# Patient Record
Sex: Female | Born: 1985
Health system: Southern US, Community
[De-identification: ages and names within clinical notes are randomized; demographics above are authoritative.]

## PROBLEM LIST (undated history)

## (undated) DIAGNOSIS — Z1371 Encounter for nonprocreative screening for genetic disease carrier status: Secondary | ICD-10-CM

## (undated) DIAGNOSIS — Z803 Family history of malignant neoplasm of breast: Secondary | ICD-10-CM

## (undated) DIAGNOSIS — Z9289 Personal history of other medical treatment: Secondary | ICD-10-CM

## (undated) DIAGNOSIS — R61 Generalized hyperhidrosis: Secondary | ICD-10-CM

## (undated) DIAGNOSIS — N83209 Unspecified ovarian cyst, unspecified side: Secondary | ICD-10-CM

## (undated) HISTORY — DX: Encounter for nonprocreative screening for genetic disease carrier status: Z13.71

## (undated) HISTORY — DX: Generalized hyperhidrosis: R61

## (undated) HISTORY — DX: Personal history of other medical treatment: Z92.89

## (undated) HISTORY — DX: Family history of malignant neoplasm of breast: Z80.3

## (undated) HISTORY — DX: Unspecified ovarian cyst, unspecified side: N83.209

---
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
## 2005-10-06 ENCOUNTER — Ambulatory Visit: Payer: Self-pay | Admitting: Family Medicine

## 2005-11-05 ENCOUNTER — Ambulatory Visit: Payer: Self-pay | Admitting: Family Medicine

## 2006-05-07 ENCOUNTER — Emergency Department: Payer: Self-pay | Admitting: General Practice

## 2006-12-15 HISTORY — PX: APPENDECTOMY: SHX54

## 2007-04-18 ENCOUNTER — Inpatient Hospital Stay: Payer: Self-pay | Admitting: General Surgery

## 2013-03-22 DIAGNOSIS — R61 Generalized hyperhidrosis: Secondary | ICD-10-CM

## 2013-03-22 DIAGNOSIS — Z803 Family history of malignant neoplasm of breast: Secondary | ICD-10-CM

## 2013-03-22 HISTORY — DX: Family history of malignant neoplasm of breast: Z80.3

## 2013-03-22 HISTORY — DX: Generalized hyperhidrosis: R61

## 2013-07-15 DIAGNOSIS — Z1371 Encounter for nonprocreative screening for genetic disease carrier status: Secondary | ICD-10-CM

## 2013-07-15 HISTORY — DX: Encounter for nonprocreative screening for genetic disease carrier status: Z13.71

## 2016-09-22 LAB — HM PAP SMEAR

## 2016-09-23 ENCOUNTER — Other Ambulatory Visit: Payer: Self-pay | Admitting: *Deleted

## 2016-09-23 ENCOUNTER — Other Ambulatory Visit: Payer: Self-pay | Admitting: Advanced Practice Midwife

## 2016-09-23 ENCOUNTER — Inpatient Hospital Stay
Admission: RE | Admit: 2016-09-23 | Discharge: 2016-09-23 | Disposition: A | Discharge status: Home / Self Care | Payer: Self-pay | Source: Ambulatory Visit | Attending: *Deleted | Admitting: *Deleted

## 2016-09-23 DIAGNOSIS — N6320 Unspecified lump in the left breast, unspecified quadrant: Secondary | ICD-10-CM

## 2016-09-23 DIAGNOSIS — Z9289 Personal history of other medical treatment: Secondary | ICD-10-CM

## 2016-10-07 ENCOUNTER — Ambulatory Visit: Payer: Self-pay

## 2016-10-07 ENCOUNTER — Other Ambulatory Visit: Payer: Self-pay

## 2016-10-09 ENCOUNTER — Ambulatory Visit
Admission: RE | Admit: 2016-10-09 | Discharge: 2016-10-09 | Disposition: A | Discharge status: Home / Self Care | Payer: BLUE CROSS/BLUE SHIELD | Source: Ambulatory Visit | Attending: Advanced Practice Midwife | Admitting: Advanced Practice Midwife

## 2016-10-09 DIAGNOSIS — N6320 Unspecified lump in the left breast, unspecified quadrant: Secondary | ICD-10-CM | POA: Insufficient documentation

## 2016-10-09 LAB — HM MAMMOGRAPHY

## 2018-01-20 ENCOUNTER — Ambulatory Visit: Payer: Self-pay | Admitting: Maternal Newborn

## 2018-02-03 ENCOUNTER — Ambulatory Visit (INDEPENDENT_AMBULATORY_CARE_PROVIDER_SITE_OTHER): Payer: BLUE CROSS/BLUE SHIELD | Admitting: Maternal Newborn

## 2018-02-03 ENCOUNTER — Encounter: Payer: Self-pay | Admitting: Maternal Newborn

## 2018-02-03 VITALS — BP 94/60 | HR 72 | Ht 64.0 in | Wt 126.0 lb

## 2018-02-03 DIAGNOSIS — B373 Candidiasis of vulva and vagina: Secondary | ICD-10-CM

## 2018-02-03 DIAGNOSIS — Z01419 Encounter for gynecological examination (general) (routine) without abnormal findings: Secondary | ICD-10-CM | POA: Diagnosis not present

## 2018-02-03 DIAGNOSIS — Z124 Encounter for screening for malignant neoplasm of cervix: Secondary | ICD-10-CM

## 2018-02-03 DIAGNOSIS — Z30011 Encounter for initial prescription of contraceptive pills: Secondary | ICD-10-CM | POA: Diagnosis not present

## 2018-02-03 DIAGNOSIS — B3731 Acute candidiasis of vulva and vagina: Secondary | ICD-10-CM

## 2018-02-03 DIAGNOSIS — Z1231 Encounter for screening mammogram for malignant neoplasm of breast: Secondary | ICD-10-CM | POA: Diagnosis not present

## 2018-02-03 MED ORDER — NORETHIN-ETH ESTRAD-FE BIPHAS 1 MG-10 MCG / 10 MCG PO TABS: 1.0000 | ORAL_TABLET | Freq: Every day | ORAL | 3 refills | Status: DC

## 2018-02-03 MED ORDER — FLUCONAZOLE 150 MG PO TABS: 150.0000 mg | ORAL_TABLET | Freq: Once | ORAL | 3 refills | Status: AC

## 2018-02-03 NOTE — Progress Notes (Signed)
  Gynecology Annual Exam  PCP: Christine Luna, No Pcp Per  Chief Complaint:  Chief Complaint  Christine Luna presents with  . Gynecologic Exam    History of Present Illness: Christine Luna is a 32 y.o. G1P1001 presents for annual exam. The Christine Luna has no complaints today.   LMP: Christine Luna's last menstrual period was 01/23/2018. Average Interval: regular, 28 days Duration of flow: several days Heavy Menses: no Clots: no Intermenstrual Bleeding: no Postcoital Bleeding: no Dysmenorrhea: no  The Christine Luna is sexually active. She currently uses condoms for contraception. She denies dyspareunia.  The Christine Luna does perform self breast exams.  There is notable family history of breast or ovarian cancer in her family.  The Christine Luna wears seatbelts: yes.   The Christine Luna has regular exercise: yes.    The Christine Luna denies current symptoms of depression.    Review of Systems  Constitutional: Negative for malaise/fatigue and weight loss.  HENT: Negative.   Eyes: Negative.   Respiratory: Negative for cough, shortness of breath and wheezing.   Cardiovascular: Negative for chest pain and palpitations.  Gastrointestinal: Negative for constipation, diarrhea, heartburn and nausea.  Genitourinary:       Vulvar and vaginal irritation  Musculoskeletal: Negative.   Skin:       Acne, taking minocycline  Neurological: Negative for dizziness and headaches.  Endo/Heme/Allergies: Negative.   Psychiatric/Behavioral: Negative for depression. The Christine Luna is not nervous/anxious.   All other systems reviewed and are negative.   Past Medical History:  Past Medical History:  Diagnosis Date  . BRCA negative 07/2013  . Family history of breast cancer 03/22/2013   mom age 42, triple neg, stage 3 BIBC; PT IS BRCA NEG; IBIS=18.1%; UPDATE MYRISK TESTING LETTER SENT 11/17  . History of Papanicolaou smear of cervix 03/22/13;09/22/16   NEG; -/-  . Hyperhidrosis 03/22/2013  . Ovarian cyst     Past Surgical History:  Past Surgical  History:  Procedure Laterality Date  . APPENDECTOMY  2008  . HERNIA REPAIR  1990    Gynecologic History:  Christine Luna's last menstrual period was 01/23/2018. Contraception: condoms Last Pap: 09/22/2016 Results were: NIL and HR HPV negative   Obstetric History: G1P1001  Family History:  Family History  Adopted: Yes  Problem Relation Age of Onset  . Breast cancer Mother 39    Social History:  Social History   Socioeconomic History  . Marital status: Married    Spouse name: Not on file  . Number of children: 1  . Years of education: 12  . Highest education level: Not on file  Social Needs  . Financial resource strain: Not on file  . Food insecurity - worry: Not on file  . Food insecurity - inability: Not on file  . Transportation needs - medical: Not on file  . Transportation needs - non-medical: Not on file  Occupational History  . Not on file  Tobacco Use  . Smoking status: Never Smoker  . Smokeless tobacco: Never Used  Substance and Sexual Activity  . Alcohol use: Yes    Comment: OCC  . Drug use: No  . Sexual activity: Yes    Birth control/protection: Condom  Other Topics Concern  . Not on file  Social History Narrative  . Not on file    Allergies:  No Active Allergies  Medications: Prior to Admission medications   Medication Sig Start Date End Date Taking? Authorizing Provider  METROGEL 1 % gel Apply 1 application topically daily. 12/09/17  Yes [provider]  minocycline (MINOCIN,DYNACIN)   100 MG capsule Take 1 capsule by mouth daily. 02/02/18  Yes [provider]    Physical Exam Vitals: Blood pressure 94/60, pulse 72, height 5' 4" (1.626 m), weight 126 lb (57.2 kg), last menstrual period 01/23/2018.  General: NAD HEENT: normocephalic, anicteric Thyroid: no enlargement, no palpable nodules Pulmonary: No increased work of breathing, CTAB Cardiovascular: RRR, S1 and S2 auscultated, no murmurs, rubs or gallops Breast: Breasts  symmetrical, no tenderness, no palpable nodules or masses, no skin or nipple retraction present, no nipple discharge.  No axillary or supraclavicular lymphadenopathy. Abdomen: soft, non-tender, non-distended.  Umbilicus without lesions.  No hepatomegaly, splenomegaly or masses palpable. No evidence of hernia  Genitourinary:  External: Vulvar irritation apparent. Otherwise, normal external  female genitalia.  Normal urethral meatus, normal Bartholin's and  Skene's glands.    Vagina: Normal vaginal mucosa, no evidence of prolapse; thick,  white discharge present.    Cervix: Grossly normal in appearance, no bleeding  Uterus: Non-enlarged, mobile, normal contour.  No CMT  Adnexa: ovaries non-enlarged, no adnexal masses  Rectal: deferred  Lymphatic: no evidence of inguinal lymphadenopathy Extremities: no edema, erythema, or tenderness Neurologic: Grossly intact Psychiatric: mood appropriate, affect full  Assessment: 32 y.o. G1P1001 routine annual exam, yeast infection.  Plan: Problem List Items Addressed This Visit    None    Visit Diagnoses    Encounter for annual routine gynecological examination    -  Primary   Encounter for oral contraception initial prescription       Relevant Medications   Norethindrone-Ethinyl Estradiol-Fe Biphas (LO LOESTRIN FE) 1 MG-10 MCG / 10 MCG tablet   Vulvovaginal candidiasis       Relevant Medications   fluconazole (DIFLUCAN) 150 MG tablet   Pap smear for cervical cancer screening       Relevant Orders   Pap IG and HPV (high risk) DNA detection   Encounter for screening mammogram for breast cancer       Relevant Orders   MM DIGITAL SCREENING BILATERAL      1) STI screening was offered and declined.  2) ASCCP guidelines and rationale discussed.  Christine Luna opts for every 3 year screening interval.  3) Contraception - Education given regarding options for contraception, including LARCs and oral contraceptives. She expresses some desire to start oral  contraceptives, but when she has tried to use them in the past, she always quit after the first month or two because of spotting. Explained that spotting is a normal side effect with OCP initiation and that it may take several months for it to resolve. Rx given for LoLoEstrin with sample pack of pills.  4) Routine healthcare maintenance including cholesterol, diabetes screening discussed; declines.  5) Screening mammogram ordered due to family history of breast cancer and diagnostic mammogram in 2017 (BI-RADS I) with recommended annual screening.  6) Has recurrent yeast infections when she takes minocycline for acne. Symptomatic today. Rx given for Diflucan and advised probiotics.  7) Follow up 1 year for routine annual exam.  Avel Sensor, CNM 02/03/2018  1:48 PM

## 2018-02-06 LAB — PAP IG AND HPV HIGH-RISK
HPV, HIGH-RISK: NEGATIVE
PAP Smear Comment: 0

## 2018-02-23 ENCOUNTER — Encounter: Payer: Self-pay | Admitting: Obstetrics and Gynecology

## 2018-03-26 DIAGNOSIS — D225 Melanocytic nevi of trunk: Secondary | ICD-10-CM | POA: Diagnosis not present

## 2018-03-26 DIAGNOSIS — D2261 Melanocytic nevi of right upper limb, including shoulder: Secondary | ICD-10-CM | POA: Diagnosis not present

## 2018-03-26 DIAGNOSIS — D2262 Melanocytic nevi of left upper limb, including shoulder: Secondary | ICD-10-CM | POA: Diagnosis not present

## 2018-03-26 DIAGNOSIS — L309 Dermatitis, unspecified: Secondary | ICD-10-CM | POA: Diagnosis not present

## 2018-07-07 DIAGNOSIS — H6693 Otitis media, unspecified, bilateral: Secondary | ICD-10-CM | POA: Diagnosis not present

## 2020-01-02 DIAGNOSIS — H5213 Myopia, bilateral: Secondary | ICD-10-CM | POA: Diagnosis not present

## 2020-01-26 ENCOUNTER — Other Ambulatory Visit: Payer: Self-pay | Admitting: Otolaryngology

## 2020-01-26 DIAGNOSIS — R519 Headache, unspecified: Secondary | ICD-10-CM | POA: Diagnosis not present

## 2020-01-26 DIAGNOSIS — J301 Allergic rhinitis due to pollen: Secondary | ICD-10-CM | POA: Diagnosis not present

## 2020-01-26 DIAGNOSIS — R221 Localized swelling, mass and lump, neck: Secondary | ICD-10-CM

## 2020-01-30 ENCOUNTER — Ambulatory Visit (INDEPENDENT_AMBULATORY_CARE_PROVIDER_SITE_OTHER): Payer: BC Managed Care – PPO | Admitting: Obstetrics and Gynecology

## 2020-01-30 ENCOUNTER — Other Ambulatory Visit: Payer: Self-pay

## 2020-01-30 ENCOUNTER — Encounter: Payer: Self-pay | Admitting: Obstetrics and Gynecology

## 2020-01-30 VITALS — BP 110/70 | Ht 64.0 in | Wt 131.0 lb

## 2020-01-30 DIAGNOSIS — Z803 Family history of malignant neoplasm of breast: Secondary | ICD-10-CM

## 2020-01-30 DIAGNOSIS — N6310 Unspecified lump in the right breast, unspecified quadrant: Secondary | ICD-10-CM

## 2020-01-30 DIAGNOSIS — Z1231 Encounter for screening mammogram for malignant neoplasm of breast: Secondary | ICD-10-CM

## 2020-01-30 NOTE — Patient Instructions (Signed)
I value your feedback and entrusting us with your care. If you get a [facility] patient survey, I would appreciate you taking the time to let us know about your experience today. Thank you!  As of November 24, 2019, your lab results will be released to your MyChart immediately, before I even have a chance to see them. Please give me time to review them and contact you if there are any abnormalities. Thank you for your patience.  

## 2020-01-30 NOTE — Progress Notes (Signed)
Patient, No Pcp Per   Chief Complaint  Patient presents with  . Breast exam    lump on right breast, little tender since Friday    HPI:      Ms. Christine Luna is a 34 y.o. G1P1001 who LMP was Patient's last menstrual period was 01/16/2020 (approximate)., presents today for slightly tender RT breast mass, noted 4 days ago. No change in size since first noticed. Does SBE and hasn't felt this before. No erythema, nipple d/c, skin changes, erythema.   Last mammo 10/09/16 without abnormlities; had breast u/s due to LT breast mass on palpation. Strong FH breast cancer in her mom age 33. Pt is BRCA/Bart neg 2014, never had update testing. IBIS=18%  Recently saw ENT for LT cervican LAN and sore throat. CT scheduled 02/07/20.  Last annual 2/19.  Past Medical History:  Diagnosis Date  . BRCA negative 07/2013   BRCA/BART neg  . Family history of breast cancer 03/22/2013   mom age 51, triple neg, stage 3 BIBC;  IBIS=18.1%; UPDATE MYRISK TESTING LETTER SENT 11/17, 3/19  . History of Papanicolaou smear of cervix 03/22/13;09/22/16   NEG; -/-  . Hyperhidrosis 03/22/2013  . Ovarian cyst     Past Surgical History:  Procedure Laterality Date  . APPENDECTOMY  2008  . HERNIA REPAIR  1990    Family History  Adopted: Yes  Problem Relation Age of Onset  . Breast cancer Mother 59       triple neg    Social History   Socioeconomic History  . Marital status: Married    Spouse name: Not on file  . Number of children: 1  . Years of education: 21  . Highest education Luna: Not on file  Occupational History  . Not on file  Tobacco Use  . Smoking status: Never Smoker  . Smokeless tobacco: Never Used  Substance and Sexual Activity  . Alcohol use: Yes    Comment: OCC  . Drug use: No  . Sexual activity: Yes    Birth control/protection: Condom  Other Topics Concern  . Not on file  Social History Narrative  . Not on file   Social Determinants of Health   Financial Resource Strain:    . Difficulty of Paying Living Expenses: Not on file  Food Insecurity:   . Worried About Charity fundraiser in the Last Year: Not on file  . Ran Out of Food in the Last Year: Not on file  Transportation Needs:   . Lack of Transportation (Medical): Not on file  . Lack of Transportation (Non-Medical): Not on file  Physical Activity:   . Days of Exercise per Week: Not on file  . Minutes of Exercise per Session: Not on file  Stress:   . Feeling of Stress : Not on file  Social Connections:   . Frequency of Communication with Friends and Family: Not on file  . Frequency of Social Gatherings with Friends and Family: Not on file  . Attends Religious Services: Not on file  . Active Member of Clubs or Organizations: Not on file  . Attends Archivist Meetings: Not on file  . Marital Status: Not on file  Intimate Partner Violence:   . Fear of Current or Ex-Partner: Not on file  . Emotionally Abused: Not on file  . Physically Abused: Not on file  . Sexually Abused: Not on file    Outpatient Medications Prior to Visit  Medication Sig Dispense Refill  .  METROGEL 1 % gel Apply 1 application topically daily.  6  . minocycline (MINOCIN,DYNACIN) 100 MG capsule Take 1 capsule by mouth daily.  0  . Norethindrone-Ethinyl Estradiol-Fe Biphas (LO LOESTRIN FE) 1 MG-10 MCG / 10 MCG tablet Take 1 tablet by mouth daily. 84 tablet 3   No facility-administered medications prior to visit.      ROS:  Review of Systems  Constitutional: Negative for fever.  Gastrointestinal: Negative for blood in stool, constipation, diarrhea, nausea and vomiting.  Genitourinary: Negative for dyspareunia, dysuria, flank pain, frequency, hematuria, urgency, vaginal bleeding, vaginal discharge and vaginal pain.  Musculoskeletal: Negative for back pain.  Skin: Negative for rash.   BREAST: mass, tenderness   OBJECTIVE:   Vitals:  BP 110/70   Ht '5\' 4"'  (1.626 m)   Wt 131 lb (59.4 kg)   LMP 01/16/2020  (Approximate)   BMI 22.49 kg/m   Physical Exam Vitals reviewed.  Pulmonary:     Effort: Pulmonary effort is normal.  Chest:     Breasts: Breasts are symmetrical.        Right: No inverted nipple, mass, nipple discharge, skin change or tenderness.        Left: No inverted nipple, mass, nipple discharge, skin change or tenderness.    Musculoskeletal:        General: Normal range of motion.     Cervical back: Normal range of motion.  Skin:    General: Skin is warm and dry.  Neurological:     General: No focal deficit present.     Mental Status: She is alert and oriented to person, place, and time.     Cranial Nerves: No cranial nerve deficit.  Psychiatric:        Mood and Affect: Mood normal.        Behavior: Behavior normal.        Thought Content: Thought content normal.        Judgment: Judgment normal.     Assessment/Plan: Breast mass, right - Plan: US BREAST LTD UNI RIGHT INC AXILLA, US BREAST LTD UNI LEFT INC AXILLA, MM DIAG BREAST TOMO BILATERAL; RT breast 11-2:00 position. Check dx mammo and u/s. Will f/u with results.   Encounter for screening mammogram for malignant neoplasm of breast - Plan: US BREAST LTD UNI RIGHT INC AXILLA, US BREAST LTD UNI LEFT INC AXILLA, MM DIAG BREAST TOMO BILATERAL  Family history of breast cancer - Plan: US BREAST LTD UNI RIGHT INC AXILLA, US BREAST LTD UNI LEFT INC AXILLA, MM DIAG BREAST TOMO BILATERAL; Pt is BRCA neg. Discussed updated cancer genetic testing. Pt will wait till annual.     Return if symptoms worsen or fail to improve.  Alvin Rubano B. Aditri Louischarles, PA-C 01/30/2020 4:53 PM

## 2020-01-31 ENCOUNTER — Telehealth: Payer: Self-pay | Admitting: Obstetrics and Gynecology

## 2020-01-31 NOTE — Telephone Encounter (Signed)
Patient aware of her appt time and date at [facility] on 02/01/20 at 2:00 pm.

## 2020-02-01 ENCOUNTER — Ambulatory Visit
Admission: RE | Admit: 2020-02-01 | Discharge: 2020-02-01 | Disposition: A | Discharge status: Home / Self Care | Payer: BC Managed Care – PPO | Source: Ambulatory Visit | Attending: Obstetrics and Gynecology | Admitting: Obstetrics and Gynecology

## 2020-02-01 DIAGNOSIS — Z803 Family history of malignant neoplasm of breast: Secondary | ICD-10-CM

## 2020-02-01 DIAGNOSIS — Z1231 Encounter for screening mammogram for malignant neoplasm of breast: Secondary | ICD-10-CM

## 2020-02-01 DIAGNOSIS — R922 Inconclusive mammogram: Secondary | ICD-10-CM | POA: Diagnosis not present

## 2020-02-01 DIAGNOSIS — N6310 Unspecified lump in the right breast, unspecified quadrant: Secondary | ICD-10-CM

## 2020-02-01 DIAGNOSIS — N6311 Unspecified lump in the right breast, upper outer quadrant: Secondary | ICD-10-CM | POA: Diagnosis not present

## 2020-02-01 DIAGNOSIS — N6312 Unspecified lump in the right breast, upper inner quadrant: Secondary | ICD-10-CM | POA: Diagnosis not present

## 2020-02-02 ENCOUNTER — Other Ambulatory Visit: Payer: Self-pay | Admitting: Obstetrics and Gynecology

## 2020-02-02 ENCOUNTER — Encounter: Payer: Self-pay | Admitting: Obstetrics and Gynecology

## 2020-02-02 DIAGNOSIS — R928 Other abnormal and inconclusive findings on diagnostic imaging of breast: Secondary | ICD-10-CM

## 2020-02-02 NOTE — Telephone Encounter (Signed)
Biopsy info.

## 2020-02-03 DIAGNOSIS — Z171 Estrogen receptor negative status [ER-]: Secondary | ICD-10-CM | POA: Diagnosis not present

## 2020-02-03 DIAGNOSIS — N6315 Unspecified lump in the right breast, overlapping quadrants: Secondary | ICD-10-CM | POA: Diagnosis not present

## 2020-02-03 DIAGNOSIS — C50811 Malignant neoplasm of overlapping sites of right female breast: Secondary | ICD-10-CM | POA: Diagnosis not present

## 2020-02-03 DIAGNOSIS — C50911 Malignant neoplasm of unspecified site of right female breast: Secondary | ICD-10-CM | POA: Diagnosis not present

## 2020-02-03 DIAGNOSIS — N6311 Unspecified lump in the right breast, upper outer quadrant: Secondary | ICD-10-CM | POA: Diagnosis not present

## 2020-02-06 MED ORDER — NITROFURANTOIN MONOHYD MACRO 100 MG PO CAPS: 100.0000 mg | ORAL_CAPSULE | Freq: Two times a day (BID) | ORAL | 0 refills | Status: AC

## 2020-02-07 ENCOUNTER — Ambulatory Visit: Admission: RE | Admit: 2020-02-07 | Payer: BC Managed Care – PPO | Source: Ambulatory Visit

## 2020-02-13 DIAGNOSIS — C50919 Malignant neoplasm of unspecified site of unspecified female breast: Secondary | ICD-10-CM

## 2020-02-13 HISTORY — DX: Malignant neoplasm of unspecified site of unspecified female breast: C50.919

## 2020-02-15 ENCOUNTER — Ambulatory Visit: Payer: BC Managed Care – PPO

## 2020-02-16 ENCOUNTER — Ambulatory Visit: Payer: BC Managed Care – PPO | Attending: Internal Medicine

## 2020-02-16 DIAGNOSIS — Z23 Encounter for immunization: Secondary | ICD-10-CM

## 2020-02-16 NOTE — Progress Notes (Signed)
   Covid-19 Vaccination Clinic  Name:  Christine Luna    MRN: [id number] DOB: 1986/03/01  02/16/2020  Christine Luna was observed post Covid-19 immunization for 15 minutes without incident. She was provided with Vaccine Information Sheet and instruction to access the V-Safe system.   Christine Luna was instructed to call 911 with any severe reactions post vaccine: Marland Kitchen Difficulty breathing  . Swelling of face and throat  . A fast heartbeat  . A bad rash all over body  . Dizziness and weakness

## 2020-02-20 DIAGNOSIS — N6325 Unspecified lump in the left breast, overlapping quadrants: Secondary | ICD-10-CM | POA: Diagnosis not present

## 2020-02-20 DIAGNOSIS — C50919 Malignant neoplasm of unspecified site of unspecified female breast: Secondary | ICD-10-CM | POA: Diagnosis not present

## 2020-02-20 DIAGNOSIS — F418 Other specified anxiety disorders: Secondary | ICD-10-CM | POA: Diagnosis not present

## 2020-02-20 DIAGNOSIS — Z171 Estrogen receptor negative status [ER-]: Secondary | ICD-10-CM | POA: Diagnosis not present

## 2020-02-20 DIAGNOSIS — C50411 Malignant neoplasm of upper-outer quadrant of right female breast: Secondary | ICD-10-CM | POA: Diagnosis not present

## 2020-02-20 DIAGNOSIS — N959 Unspecified menopausal and perimenopausal disorder: Secondary | ICD-10-CM | POA: Diagnosis not present

## 2020-02-20 DIAGNOSIS — F419 Anxiety disorder, unspecified: Secondary | ICD-10-CM | POA: Diagnosis not present

## 2020-02-20 DIAGNOSIS — Z803 Family history of malignant neoplasm of breast: Secondary | ICD-10-CM | POA: Diagnosis not present

## 2020-02-20 DIAGNOSIS — C50911 Malignant neoplasm of unspecified site of right female breast: Secondary | ICD-10-CM | POA: Diagnosis not present

## 2020-02-21 ENCOUNTER — Encounter: Payer: Self-pay | Admitting: Obstetrics and Gynecology

## 2020-02-27 DIAGNOSIS — N6310 Unspecified lump in the right breast, unspecified quadrant: Secondary | ICD-10-CM | POA: Diagnosis not present

## 2020-02-27 DIAGNOSIS — N83201 Unspecified ovarian cyst, right side: Secondary | ICD-10-CM | POA: Diagnosis not present

## 2020-02-27 DIAGNOSIS — C50411 Malignant neoplasm of upper-outer quadrant of right female breast: Secondary | ICD-10-CM | POA: Diagnosis not present

## 2020-02-27 DIAGNOSIS — R911 Solitary pulmonary nodule: Secondary | ICD-10-CM | POA: Diagnosis not present

## 2020-02-27 DIAGNOSIS — Z171 Estrogen receptor negative status [ER-]: Secondary | ICD-10-CM | POA: Diagnosis not present

## 2020-02-28 DIAGNOSIS — N6325 Unspecified lump in the left breast, overlapping quadrants: Secondary | ICD-10-CM | POA: Diagnosis not present

## 2020-02-28 DIAGNOSIS — N959 Unspecified menopausal and perimenopausal disorder: Secondary | ICD-10-CM | POA: Diagnosis not present

## 2020-02-28 DIAGNOSIS — C50411 Malignant neoplasm of upper-outer quadrant of right female breast: Secondary | ICD-10-CM | POA: Diagnosis not present

## 2020-02-28 DIAGNOSIS — Z79899 Other long term (current) drug therapy: Secondary | ICD-10-CM | POA: Diagnosis not present

## 2020-02-28 DIAGNOSIS — N6032 Fibrosclerosis of left breast: Secondary | ICD-10-CM | POA: Diagnosis not present

## 2020-02-28 DIAGNOSIS — Z171 Estrogen receptor negative status [ER-]: Secondary | ICD-10-CM | POA: Diagnosis not present

## 2020-02-28 DIAGNOSIS — N6012 Diffuse cystic mastopathy of left breast: Secondary | ICD-10-CM | POA: Diagnosis not present

## 2020-02-28 DIAGNOSIS — N62 Hypertrophy of breast: Secondary | ICD-10-CM | POA: Diagnosis not present

## 2020-02-28 DIAGNOSIS — N83201 Unspecified ovarian cyst, right side: Secondary | ICD-10-CM | POA: Diagnosis not present

## 2020-02-28 DIAGNOSIS — F418 Other specified anxiety disorders: Secondary | ICD-10-CM | POA: Diagnosis not present

## 2020-02-29 DIAGNOSIS — D252 Subserosal leiomyoma of uterus: Secondary | ICD-10-CM | POA: Diagnosis not present

## 2020-02-29 DIAGNOSIS — N83201 Unspecified ovarian cyst, right side: Secondary | ICD-10-CM | POA: Diagnosis not present

## 2020-02-29 DIAGNOSIS — C50919 Malignant neoplasm of unspecified site of unspecified female breast: Secondary | ICD-10-CM | POA: Diagnosis not present

## 2020-03-01 DIAGNOSIS — Z171 Estrogen receptor negative status [ER-]: Secondary | ICD-10-CM | POA: Diagnosis not present

## 2020-03-01 DIAGNOSIS — C50411 Malignant neoplasm of upper-outer quadrant of right female breast: Secondary | ICD-10-CM | POA: Diagnosis not present

## 2020-03-01 DIAGNOSIS — F418 Other specified anxiety disorders: Secondary | ICD-10-CM | POA: Diagnosis not present

## 2020-03-01 DIAGNOSIS — N959 Unspecified menopausal and perimenopausal disorder: Secondary | ICD-10-CM | POA: Diagnosis not present

## 2020-03-01 DIAGNOSIS — N83201 Unspecified ovarian cyst, right side: Secondary | ICD-10-CM | POA: Diagnosis not present

## 2020-03-02 ENCOUNTER — Encounter: Payer: Self-pay | Admitting: Obstetrics and Gynecology

## 2020-03-03 ENCOUNTER — Encounter: Payer: Self-pay | Admitting: Obstetrics and Gynecology

## 2020-03-06 DIAGNOSIS — C50811 Malignant neoplasm of overlapping sites of right female breast: Secondary | ICD-10-CM | POA: Diagnosis not present

## 2020-03-06 DIAGNOSIS — Z171 Estrogen receptor negative status [ER-]: Secondary | ICD-10-CM | POA: Diagnosis not present

## 2020-03-06 DIAGNOSIS — F419 Anxiety disorder, unspecified: Secondary | ICD-10-CM | POA: Diagnosis not present

## 2020-03-06 DIAGNOSIS — N959 Unspecified menopausal and perimenopausal disorder: Secondary | ICD-10-CM | POA: Diagnosis not present

## 2020-03-06 DIAGNOSIS — Z5111 Encounter for antineoplastic chemotherapy: Secondary | ICD-10-CM | POA: Diagnosis not present

## 2020-03-06 DIAGNOSIS — N83201 Unspecified ovarian cyst, right side: Secondary | ICD-10-CM | POA: Diagnosis not present

## 2020-03-06 DIAGNOSIS — C50411 Malignant neoplasm of upper-outer quadrant of right female breast: Secondary | ICD-10-CM | POA: Diagnosis not present

## 2020-03-06 DIAGNOSIS — F418 Other specified anxiety disorders: Secondary | ICD-10-CM | POA: Diagnosis not present

## 2020-03-14 ENCOUNTER — Ambulatory Visit: Payer: BC Managed Care – PPO | Attending: Internal Medicine

## 2020-03-14 DIAGNOSIS — Z23 Encounter for immunization: Secondary | ICD-10-CM

## 2020-03-14 NOTE — Progress Notes (Signed)
   Covid-19 Vaccination Clinic  Name:  Christine Luna    MRN: [id number] DOB: Apr 27, 1986  03/14/2020  Christine Luna was observed post Covid-19 immunization for 15 minutes without incident. She was provided with Vaccine Information Sheet and instruction to access the V-Safe system.   Christine Luna was instructed to call 911 with any severe reactions post vaccine: Marland Kitchen Difficulty breathing  . Swelling of face and throat  . A fast heartbeat  . A bad rash all over body  . Dizziness and weakness   Immunizations Administered    Name Date Dose VIS Date Route   Pfizer COVID-19 Vaccine 03/14/2020  9:15 AM 0.3 mL 11/25/2019 Intramuscular   Manufacturer: [employer], [employer]   Lot: [id number]   [place]: [id number]

## 2020-03-19 DIAGNOSIS — N83201 Unspecified ovarian cyst, right side: Secondary | ICD-10-CM | POA: Diagnosis not present

## 2020-03-19 DIAGNOSIS — E2839 Other primary ovarian failure: Secondary | ICD-10-CM | POA: Diagnosis not present

## 2020-03-19 DIAGNOSIS — Z5111 Encounter for antineoplastic chemotherapy: Secondary | ICD-10-CM | POA: Diagnosis not present

## 2020-03-19 DIAGNOSIS — C50411 Malignant neoplasm of upper-outer quadrant of right female breast: Secondary | ICD-10-CM | POA: Diagnosis not present

## 2020-03-19 DIAGNOSIS — C50811 Malignant neoplasm of overlapping sites of right female breast: Secondary | ICD-10-CM | POA: Diagnosis not present

## 2020-03-19 DIAGNOSIS — Z1379 Encounter for other screening for genetic and chromosomal anomalies: Secondary | ICD-10-CM | POA: Diagnosis not present

## 2020-03-19 DIAGNOSIS — F419 Anxiety disorder, unspecified: Secondary | ICD-10-CM | POA: Diagnosis not present

## 2020-03-19 DIAGNOSIS — Z171 Estrogen receptor negative status [ER-]: Secondary | ICD-10-CM | POA: Diagnosis not present

## 2020-04-02 DIAGNOSIS — F419 Anxiety disorder, unspecified: Secondary | ICD-10-CM | POA: Diagnosis not present

## 2020-04-02 DIAGNOSIS — N83201 Unspecified ovarian cyst, right side: Secondary | ICD-10-CM | POA: Diagnosis not present

## 2020-04-02 DIAGNOSIS — G4700 Insomnia, unspecified: Secondary | ICD-10-CM | POA: Diagnosis not present

## 2020-04-02 DIAGNOSIS — N959 Unspecified menopausal and perimenopausal disorder: Secondary | ICD-10-CM | POA: Diagnosis not present

## 2020-04-02 DIAGNOSIS — Z1379 Encounter for other screening for genetic and chromosomal anomalies: Secondary | ICD-10-CM | POA: Diagnosis not present

## 2020-04-02 DIAGNOSIS — C50411 Malignant neoplasm of upper-outer quadrant of right female breast: Secondary | ICD-10-CM | POA: Diagnosis not present

## 2020-04-02 DIAGNOSIS — Z5111 Encounter for antineoplastic chemotherapy: Secondary | ICD-10-CM | POA: Diagnosis not present

## 2020-04-02 DIAGNOSIS — Z79899 Other long term (current) drug therapy: Secondary | ICD-10-CM | POA: Diagnosis not present

## 2020-04-02 DIAGNOSIS — Z171 Estrogen receptor negative status [ER-]: Secondary | ICD-10-CM | POA: Diagnosis not present

## 2020-04-02 DIAGNOSIS — F418 Other specified anxiety disorders: Secondary | ICD-10-CM | POA: Diagnosis not present

## 2020-04-16 DIAGNOSIS — Z171 Estrogen receptor negative status [ER-]: Secondary | ICD-10-CM | POA: Diagnosis not present

## 2020-04-16 DIAGNOSIS — Z5111 Encounter for antineoplastic chemotherapy: Secondary | ICD-10-CM | POA: Diagnosis not present

## 2020-04-16 DIAGNOSIS — C50411 Malignant neoplasm of upper-outer quadrant of right female breast: Secondary | ICD-10-CM | POA: Diagnosis not present

## 2020-04-16 DIAGNOSIS — Z1379 Encounter for other screening for genetic and chromosomal anomalies: Secondary | ICD-10-CM | POA: Diagnosis not present

## 2020-04-23 DIAGNOSIS — Z171 Estrogen receptor negative status [ER-]: Secondary | ICD-10-CM | POA: Diagnosis not present

## 2020-04-23 DIAGNOSIS — C50411 Malignant neoplasm of upper-outer quadrant of right female breast: Secondary | ICD-10-CM | POA: Diagnosis not present

## 2020-04-30 DIAGNOSIS — Z1379 Encounter for other screening for genetic and chromosomal anomalies: Secondary | ICD-10-CM | POA: Diagnosis not present

## 2020-04-30 DIAGNOSIS — N83201 Unspecified ovarian cyst, right side: Secondary | ICD-10-CM | POA: Diagnosis not present

## 2020-04-30 DIAGNOSIS — Z5111 Encounter for antineoplastic chemotherapy: Secondary | ICD-10-CM | POA: Diagnosis not present

## 2020-04-30 DIAGNOSIS — Z171 Estrogen receptor negative status [ER-]: Secondary | ICD-10-CM | POA: Diagnosis not present

## 2020-04-30 DIAGNOSIS — F419 Anxiety disorder, unspecified: Secondary | ICD-10-CM | POA: Diagnosis not present

## 2020-04-30 DIAGNOSIS — F418 Other specified anxiety disorders: Secondary | ICD-10-CM | POA: Diagnosis not present

## 2020-04-30 DIAGNOSIS — Z79899 Other long term (current) drug therapy: Secondary | ICD-10-CM | POA: Diagnosis not present

## 2020-04-30 DIAGNOSIS — C50411 Malignant neoplasm of upper-outer quadrant of right female breast: Secondary | ICD-10-CM | POA: Diagnosis not present

## 2020-04-30 DIAGNOSIS — Z803 Family history of malignant neoplasm of breast: Secondary | ICD-10-CM | POA: Diagnosis not present

## 2020-05-09 DIAGNOSIS — Z853 Personal history of malignant neoplasm of breast: Secondary | ICD-10-CM | POA: Diagnosis not present

## 2020-05-09 DIAGNOSIS — Z9013 Acquired absence of bilateral breasts and nipples: Secondary | ICD-10-CM | POA: Diagnosis not present

## 2020-05-15 DIAGNOSIS — Z5111 Encounter for antineoplastic chemotherapy: Secondary | ICD-10-CM | POA: Diagnosis not present

## 2020-05-15 DIAGNOSIS — E2839 Other primary ovarian failure: Secondary | ICD-10-CM | POA: Diagnosis not present

## 2020-05-15 DIAGNOSIS — C50811 Malignant neoplasm of overlapping sites of right female breast: Secondary | ICD-10-CM | POA: Diagnosis not present

## 2020-05-15 DIAGNOSIS — N83201 Unspecified ovarian cyst, right side: Secondary | ICD-10-CM | POA: Diagnosis not present

## 2020-05-15 DIAGNOSIS — F419 Anxiety disorder, unspecified: Secondary | ICD-10-CM | POA: Diagnosis not present

## 2020-05-15 DIAGNOSIS — Z1379 Encounter for other screening for genetic and chromosomal anomalies: Secondary | ICD-10-CM | POA: Diagnosis not present

## 2020-05-15 DIAGNOSIS — C50411 Malignant neoplasm of upper-outer quadrant of right female breast: Secondary | ICD-10-CM | POA: Diagnosis not present

## 2020-05-15 DIAGNOSIS — Z171 Estrogen receptor negative status [ER-]: Secondary | ICD-10-CM | POA: Diagnosis not present

## 2020-05-15 DIAGNOSIS — R05 Cough: Secondary | ICD-10-CM | POA: Diagnosis not present

## 2020-05-16 DIAGNOSIS — Z171 Estrogen receptor negative status [ER-]: Secondary | ICD-10-CM | POA: Diagnosis not present

## 2020-05-16 DIAGNOSIS — C50411 Malignant neoplasm of upper-outer quadrant of right female breast: Secondary | ICD-10-CM | POA: Diagnosis not present

## 2020-05-28 DIAGNOSIS — R7989 Other specified abnormal findings of blood chemistry: Secondary | ICD-10-CM | POA: Diagnosis not present

## 2020-05-28 DIAGNOSIS — C50411 Malignant neoplasm of upper-outer quadrant of right female breast: Secondary | ICD-10-CM | POA: Diagnosis not present

## 2020-05-28 DIAGNOSIS — Z1379 Encounter for other screening for genetic and chromosomal anomalies: Secondary | ICD-10-CM | POA: Diagnosis not present

## 2020-05-28 DIAGNOSIS — Z5111 Encounter for antineoplastic chemotherapy: Secondary | ICD-10-CM | POA: Diagnosis not present

## 2020-05-28 DIAGNOSIS — N83201 Unspecified ovarian cyst, right side: Secondary | ICD-10-CM | POA: Diagnosis not present

## 2020-05-28 DIAGNOSIS — Z171 Estrogen receptor negative status [ER-]: Secondary | ICD-10-CM | POA: Diagnosis not present

## 2020-05-28 DIAGNOSIS — F418 Other specified anxiety disorders: Secondary | ICD-10-CM | POA: Diagnosis not present

## 2020-05-28 DIAGNOSIS — F419 Anxiety disorder, unspecified: Secondary | ICD-10-CM | POA: Diagnosis not present

## 2020-05-28 DIAGNOSIS — R7401 Elevation of levels of liver transaminase levels: Secondary | ICD-10-CM | POA: Diagnosis not present

## 2020-06-04 DIAGNOSIS — Z5111 Encounter for antineoplastic chemotherapy: Secondary | ICD-10-CM | POA: Diagnosis not present

## 2020-06-04 DIAGNOSIS — C50411 Malignant neoplasm of upper-outer quadrant of right female breast: Secondary | ICD-10-CM | POA: Diagnosis not present

## 2020-06-04 DIAGNOSIS — R7401 Elevation of levels of liver transaminase levels: Secondary | ICD-10-CM | POA: Diagnosis not present

## 2020-06-04 DIAGNOSIS — Z3184 Encounter for fertility preservation procedure: Secondary | ICD-10-CM | POA: Diagnosis not present

## 2020-06-04 DIAGNOSIS — R7989 Other specified abnormal findings of blood chemistry: Secondary | ICD-10-CM | POA: Diagnosis not present

## 2020-06-04 DIAGNOSIS — Z171 Estrogen receptor negative status [ER-]: Secondary | ICD-10-CM | POA: Diagnosis not present

## 2020-06-04 DIAGNOSIS — Z79899 Other long term (current) drug therapy: Secondary | ICD-10-CM | POA: Diagnosis not present

## 2020-06-04 DIAGNOSIS — N83201 Unspecified ovarian cyst, right side: Secondary | ICD-10-CM | POA: Diagnosis not present

## 2020-06-04 DIAGNOSIS — N6315 Unspecified lump in the right breast, overlapping quadrants: Secondary | ICD-10-CM | POA: Diagnosis not present

## 2020-06-04 DIAGNOSIS — Z1379 Encounter for other screening for genetic and chromosomal anomalies: Secondary | ICD-10-CM | POA: Diagnosis not present

## 2020-06-04 DIAGNOSIS — F419 Anxiety disorder, unspecified: Secondary | ICD-10-CM | POA: Diagnosis not present

## 2020-06-11 DIAGNOSIS — Z5111 Encounter for antineoplastic chemotherapy: Secondary | ICD-10-CM | POA: Diagnosis not present

## 2020-06-11 DIAGNOSIS — Z171 Estrogen receptor negative status [ER-]: Secondary | ICD-10-CM | POA: Diagnosis not present

## 2020-06-11 DIAGNOSIS — Z79899 Other long term (current) drug therapy: Secondary | ICD-10-CM | POA: Diagnosis not present

## 2020-06-11 DIAGNOSIS — Z1379 Encounter for other screening for genetic and chromosomal anomalies: Secondary | ICD-10-CM | POA: Diagnosis not present

## 2020-06-11 DIAGNOSIS — C50411 Malignant neoplasm of upper-outer quadrant of right female breast: Secondary | ICD-10-CM | POA: Diagnosis not present

## 2020-06-11 DIAGNOSIS — Z1159 Encounter for screening for other viral diseases: Secondary | ICD-10-CM | POA: Diagnosis not present

## 2020-06-11 DIAGNOSIS — F418 Other specified anxiety disorders: Secondary | ICD-10-CM | POA: Diagnosis not present

## 2020-06-11 DIAGNOSIS — F419 Anxiety disorder, unspecified: Secondary | ICD-10-CM | POA: Diagnosis not present

## 2020-06-11 DIAGNOSIS — N83201 Unspecified ovarian cyst, right side: Secondary | ICD-10-CM | POA: Diagnosis not present

## 2020-06-19 DIAGNOSIS — C50411 Malignant neoplasm of upper-outer quadrant of right female breast: Secondary | ICD-10-CM | POA: Diagnosis not present

## 2020-06-19 DIAGNOSIS — Z171 Estrogen receptor negative status [ER-]: Secondary | ICD-10-CM | POA: Diagnosis not present

## 2020-06-19 DIAGNOSIS — Z5111 Encounter for antineoplastic chemotherapy: Secondary | ICD-10-CM | POA: Diagnosis not present

## 2020-06-25 DIAGNOSIS — Z5111 Encounter for antineoplastic chemotherapy: Secondary | ICD-10-CM | POA: Diagnosis not present

## 2020-06-25 DIAGNOSIS — C50411 Malignant neoplasm of upper-outer quadrant of right female breast: Secondary | ICD-10-CM | POA: Diagnosis not present

## 2020-06-25 DIAGNOSIS — Z171 Estrogen receptor negative status [ER-]: Secondary | ICD-10-CM | POA: Diagnosis not present

## 2020-07-02 DIAGNOSIS — C50411 Malignant neoplasm of upper-outer quadrant of right female breast: Secondary | ICD-10-CM | POA: Diagnosis not present

## 2020-07-02 DIAGNOSIS — Z171 Estrogen receptor negative status [ER-]: Secondary | ICD-10-CM | POA: Diagnosis not present

## 2020-07-02 DIAGNOSIS — Z5111 Encounter for antineoplastic chemotherapy: Secondary | ICD-10-CM | POA: Diagnosis not present

## 2020-07-09 DIAGNOSIS — Z5111 Encounter for antineoplastic chemotherapy: Secondary | ICD-10-CM | POA: Diagnosis not present

## 2020-07-09 DIAGNOSIS — C50811 Malignant neoplasm of overlapping sites of right female breast: Secondary | ICD-10-CM | POA: Diagnosis not present

## 2020-07-09 DIAGNOSIS — N959 Unspecified menopausal and perimenopausal disorder: Secondary | ICD-10-CM | POA: Diagnosis not present

## 2020-07-09 DIAGNOSIS — Z1379 Encounter for other screening for genetic and chromosomal anomalies: Secondary | ICD-10-CM | POA: Diagnosis not present

## 2020-07-09 DIAGNOSIS — Z171 Estrogen receptor negative status [ER-]: Secondary | ICD-10-CM | POA: Diagnosis not present

## 2020-07-09 DIAGNOSIS — N83201 Unspecified ovarian cyst, right side: Secondary | ICD-10-CM | POA: Diagnosis not present

## 2020-07-09 DIAGNOSIS — R7401 Elevation of levels of liver transaminase levels: Secondary | ICD-10-CM | POA: Diagnosis not present

## 2020-07-09 DIAGNOSIS — C50411 Malignant neoplasm of upper-outer quadrant of right female breast: Secondary | ICD-10-CM | POA: Diagnosis not present

## 2020-07-09 DIAGNOSIS — F419 Anxiety disorder, unspecified: Secondary | ICD-10-CM | POA: Diagnosis not present

## 2020-07-09 DIAGNOSIS — E2839 Other primary ovarian failure: Secondary | ICD-10-CM | POA: Diagnosis not present

## 2020-07-30 DIAGNOSIS — Z01818 Encounter for other preprocedural examination: Secondary | ICD-10-CM | POA: Diagnosis not present

## 2020-07-30 DIAGNOSIS — Z853 Personal history of malignant neoplasm of breast: Secondary | ICD-10-CM | POA: Diagnosis not present

## 2020-11-29 IMAGING — US US BREAST*R* LIMITED INC AXILLA
1 series · 7 of 7 positions shown · non-contrast
Comparison: Previous exam(s).

CLINICAL DATA: 34-year-old female presenting for evaluation of a
tender palpable lump in the right breast identified on self breast
exam. The patient is adopted, but is aware that her mother had
history of breast cancer at age 42.

EXAM:
DIGITAL DIAGNOSTIC BILATERAL MAMMOGRAM WITH CAD AND TOMO
ULTRASOUND RIGHT BREAST

[Series 1: us breast*right* limited inc axilla · 0.06mm/px · 7 of 7 slices shown]
[im 1/7]
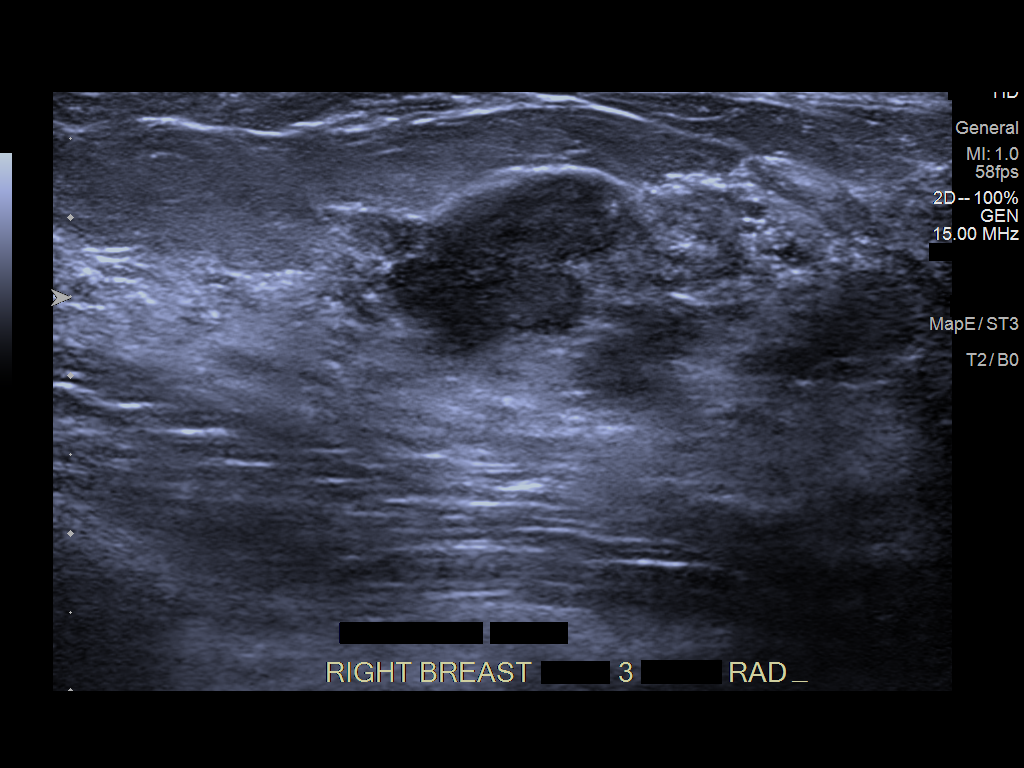
[im 2/7]
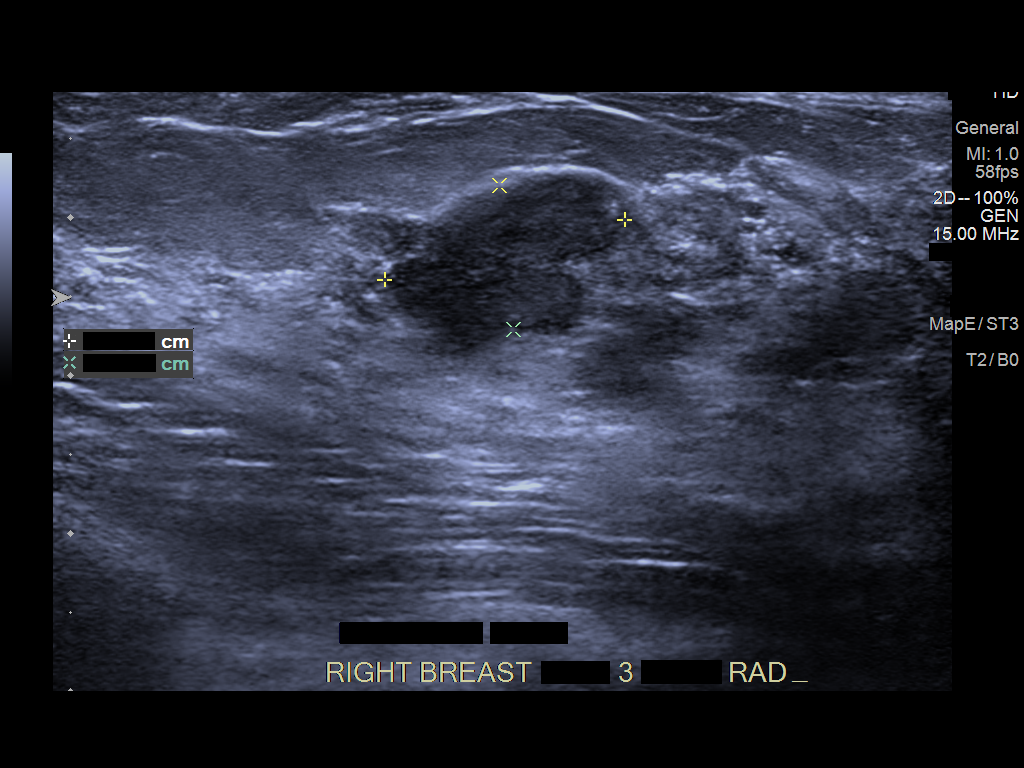
[im 3/7]
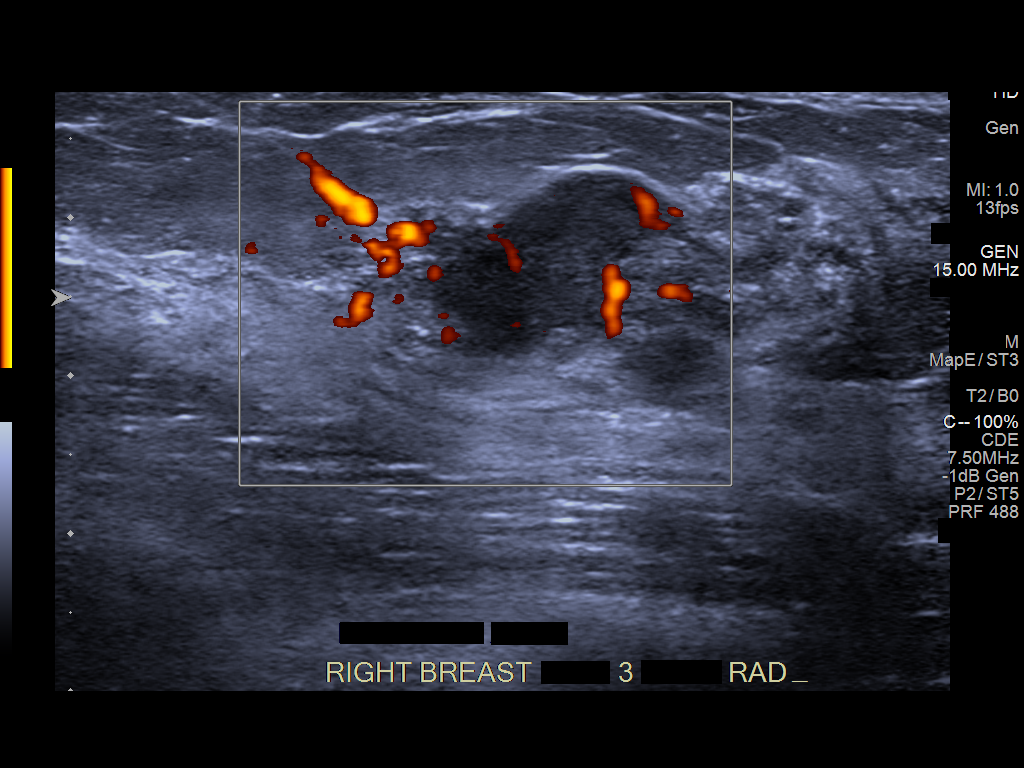
[im 4/7]
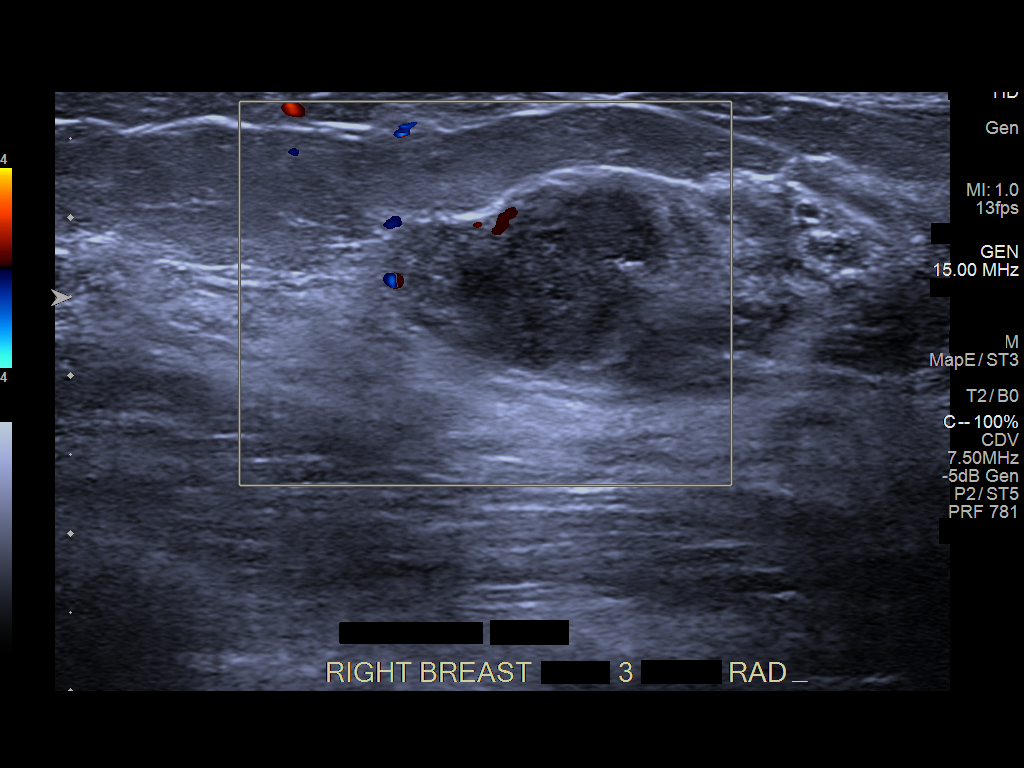
[im 5/7]
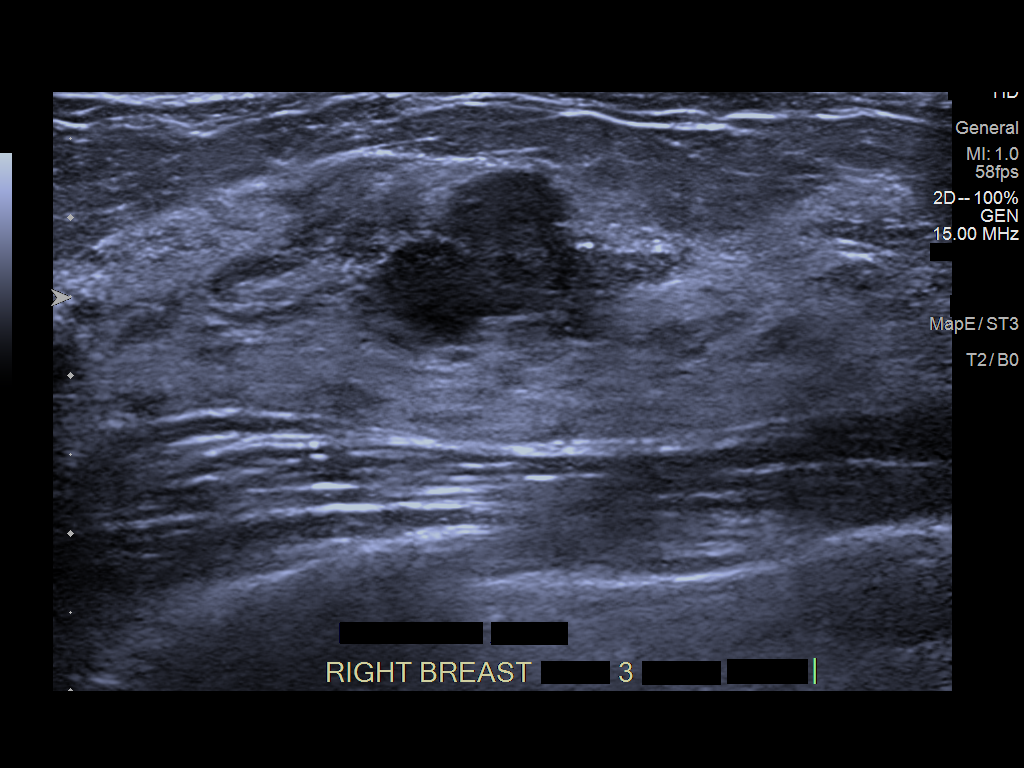
[im 6/7]
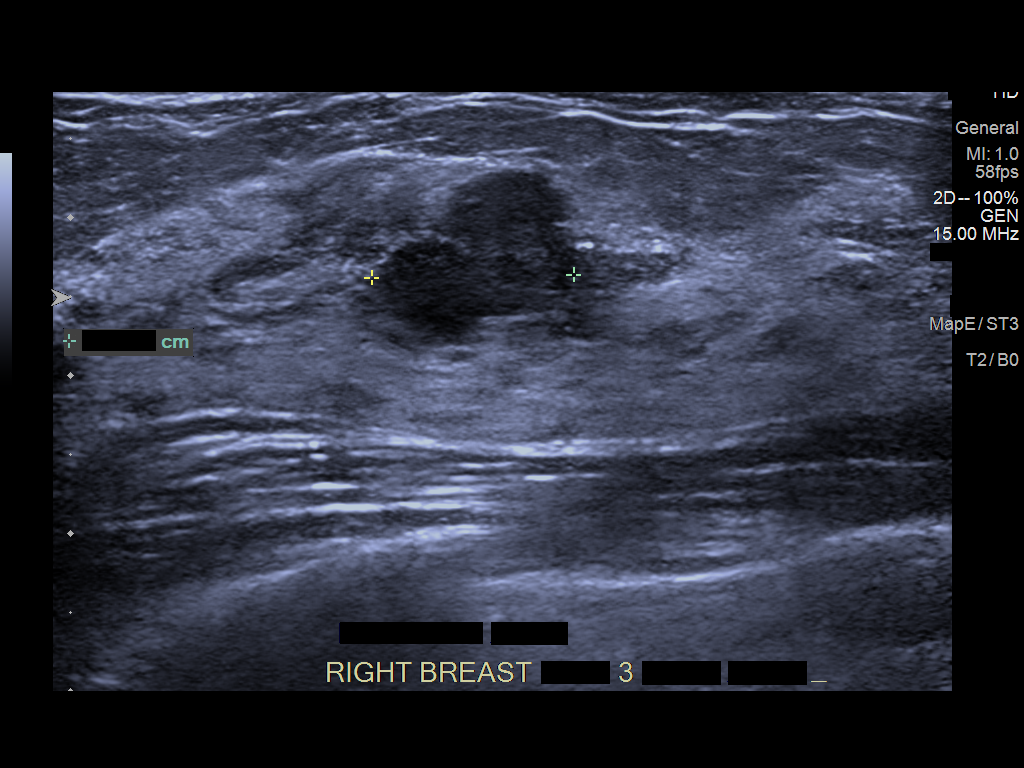
[im 7/7]
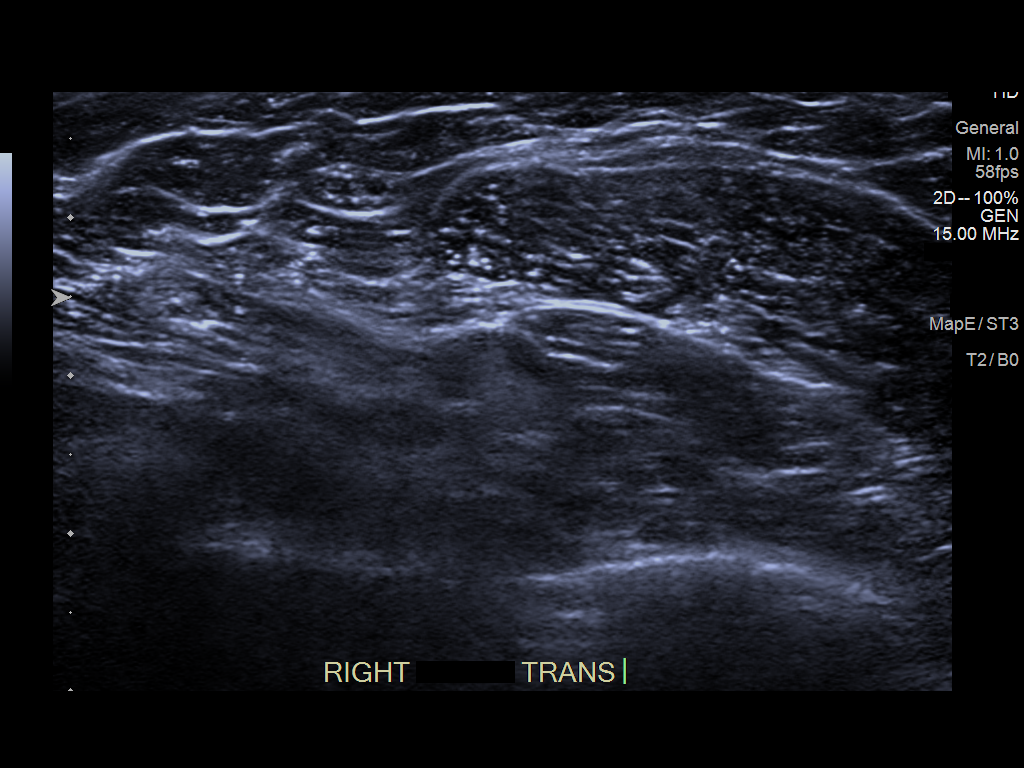

[7 of 7 positions shown; findings below may reference images not displayed]

ACR Breast Density Category d: The breast tissue is extremely dense,
which lowers the sensitivity of mammography.
FINDINGS: A BB has been placed at the palpable site of concern in the upper
inner quadrant of the right breast. No masses are seen in deep to
the palpable marker on the spot compression tomosynthesis images. No
suspicious calcifications, masses or areas of distortion are seen in
the bilateral breasts.

Mammographic images were processed with CAD.

On physical exam, a palpable tender lump is identified in the
superior aspect of the right breast.

Ultrasound of the right breast at 12 o'clock, 3 cm from the nipple
demonstrates a lobulated hypoechoic vascular mass with some
indistinct margins measuring 1.6 x 0.9 x 1.3 cm. Ultrasound of the
right axilla demonstrates multiple normal-appearing lymph nodes.
IMPRESSION: 1. There is an indeterminate mass in the right breast at 12 o'clock.

2.  No evidence of right axillary lymphadenopathy.

RECOMMENDATION:
Ultrasound guided biopsy is recommended for the right breast mass at
12 o'clock.

I have discussed the findings and recommendations with the patient.
If applicable, a reminder letter will be sent to the patient
regarding the next appointment.

BI-RADS CATEGORY  4: Suspicious.

## 2020-11-29 IMAGING — MG DIGITAL DIAGNOSTIC BILAT W/ TOMO W/ CAD
6 of 12 series · 6 of 36 positions shown · non-contrast
Comparison: Previous exam(s).

CLINICAL DATA: 34-year-old female presenting for evaluation of a
tender palpable lump in the right breast identified on self breast
exam. The patient is adopted, but is aware that her mother had
history of breast cancer at age 42.

EXAM:
DIGITAL DIAGNOSTIC BILATERAL MAMMOGRAM WITH CAD AND TOMO
ULTRASOUND RIGHT BREAST

[R XCCL synth-2D]
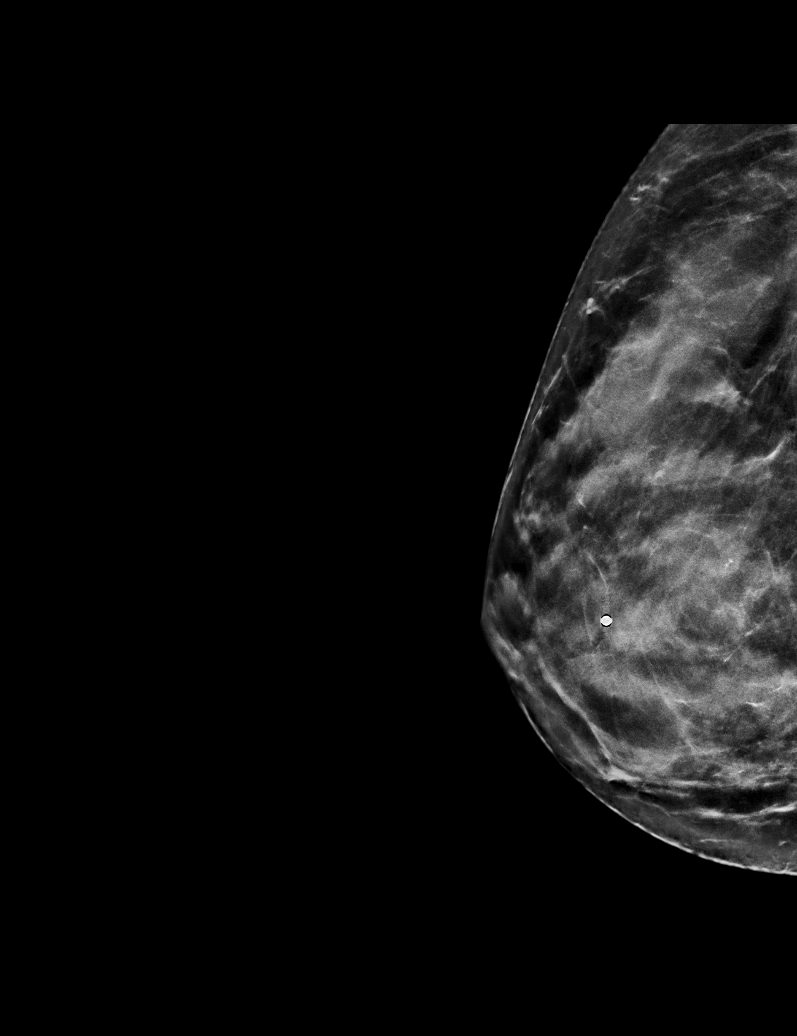

[L MLO synth-2D]
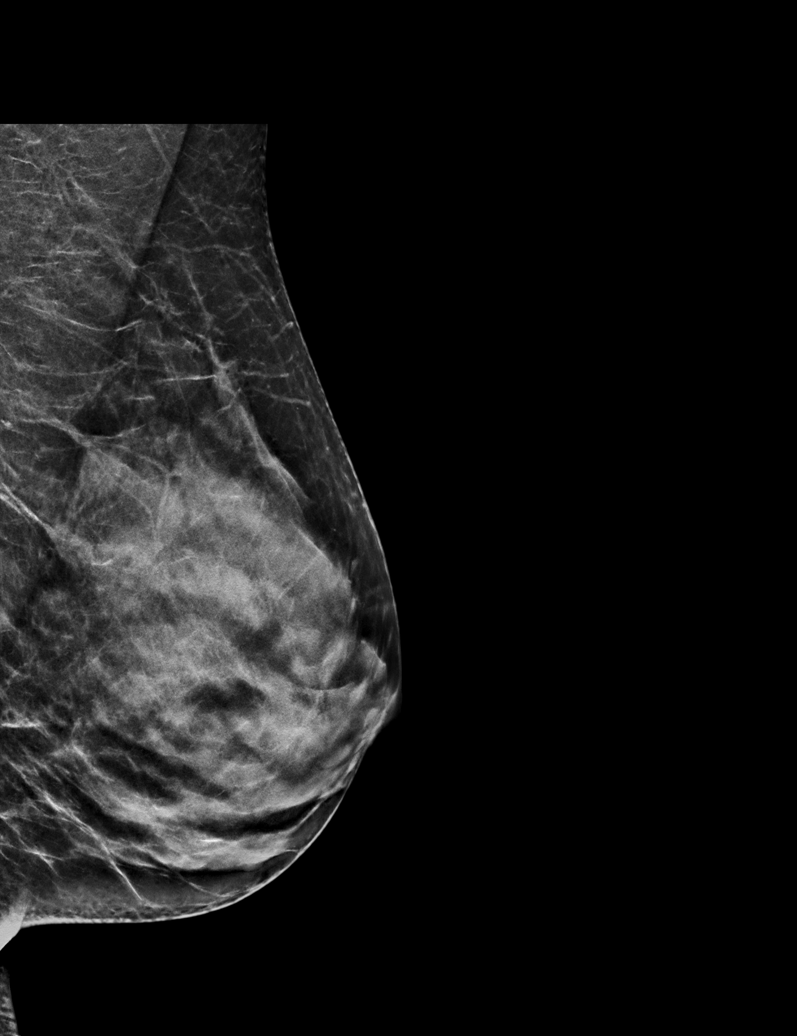

[R CC synth-2D]
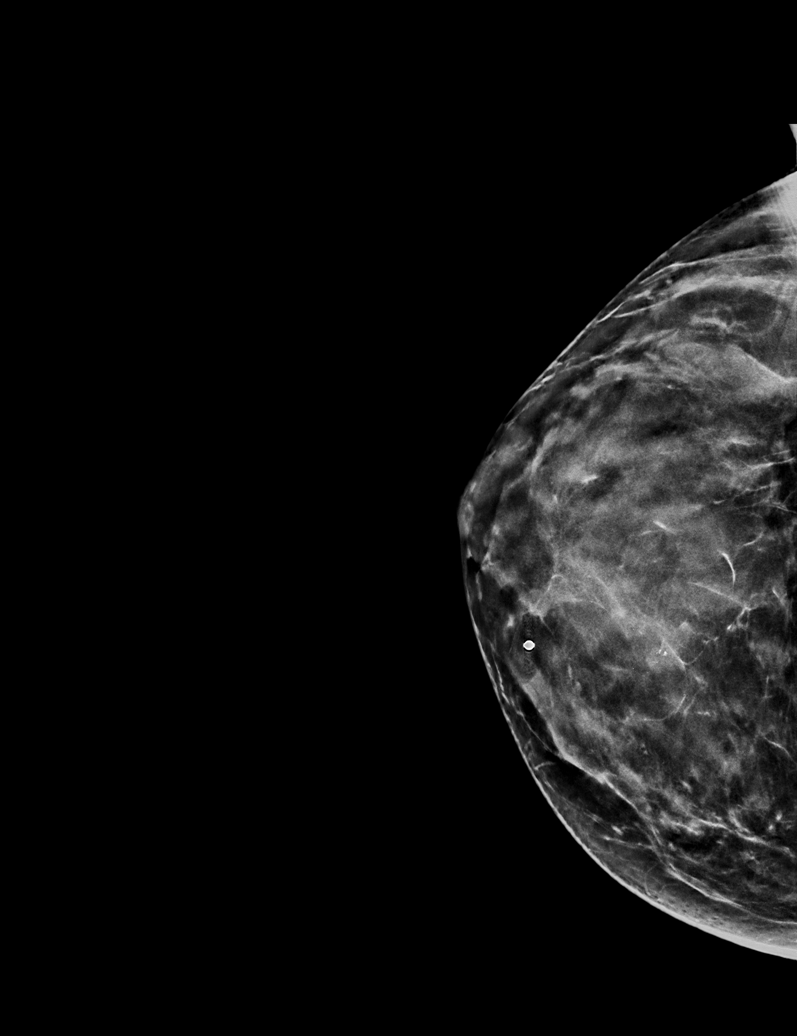

[R MLO synth-2D]
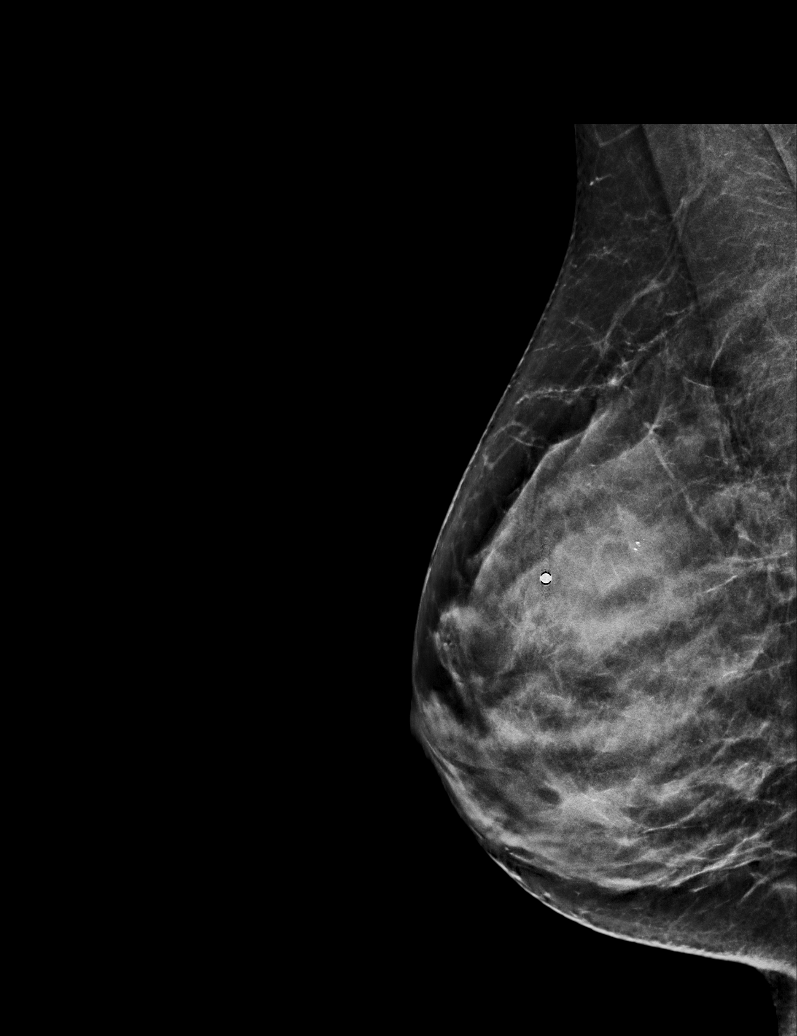

[R TAN synth-2D]
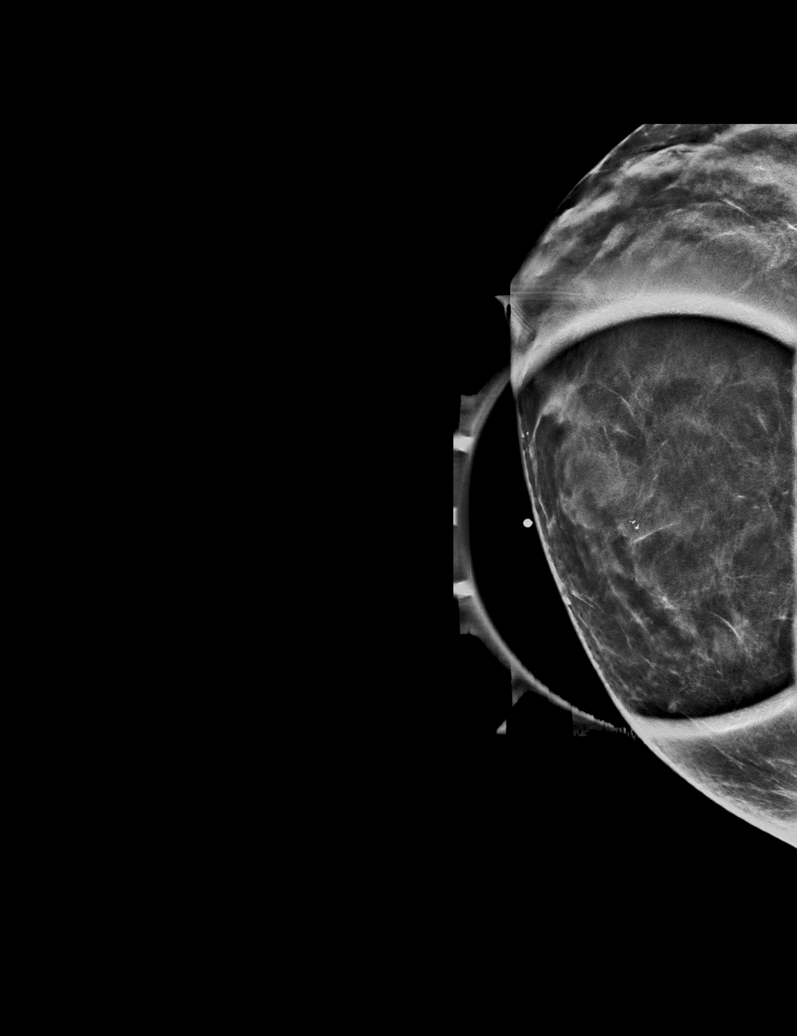

[L CC synth-2D]
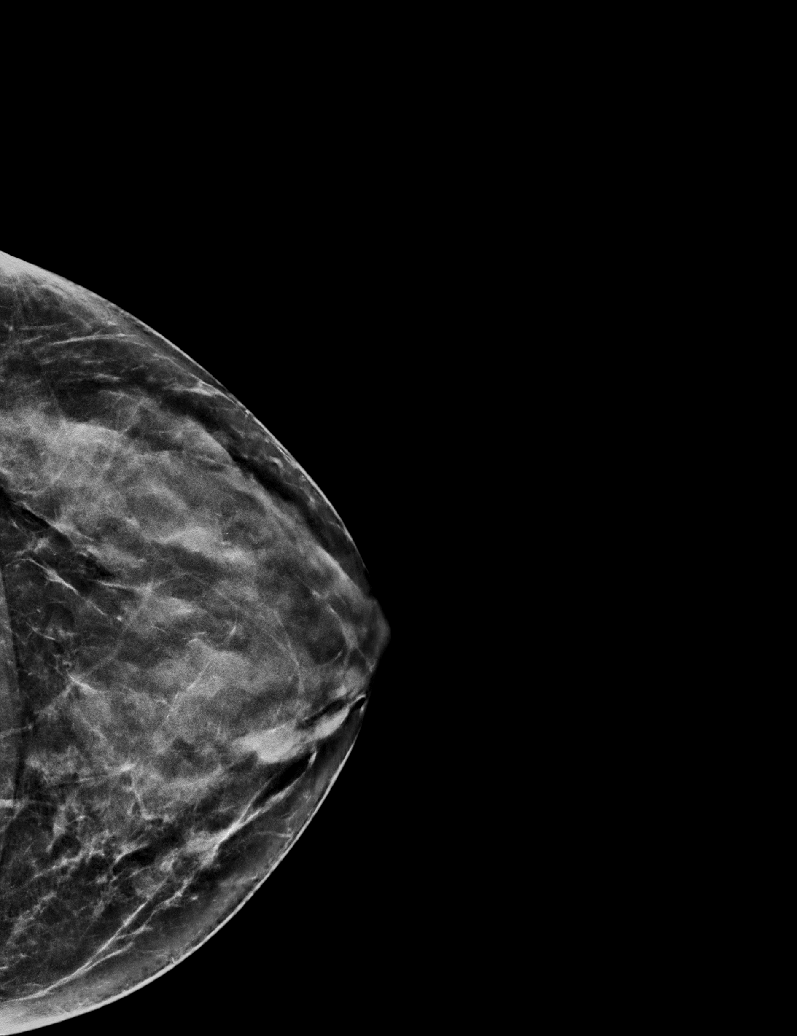

[6 of 36 positions shown; findings below may reference images not displayed]

ACR Breast Density Category d: The breast tissue is extremely dense,
which lowers the sensitivity of mammography.
FINDINGS: A BB has been placed at the palpable site of concern in the upper
inner quadrant of the right breast. No masses are seen in deep to
the palpable marker on the spot compression tomosynthesis images. No
suspicious calcifications, masses or areas of distortion are seen in
the bilateral breasts.

Mammographic images were processed with CAD.

On physical exam, a palpable tender lump is identified in the
superior aspect of the right breast.

Ultrasound of the right breast at 12 o'clock, 3 cm from the nipple
demonstrates a lobulated hypoechoic vascular mass with some
indistinct margins measuring 1.6 x 0.9 x 1.3 cm. Ultrasound of the
right axilla demonstrates multiple normal-appearing lymph nodes.
IMPRESSION: 1. There is an indeterminate mass in the right breast at 12 o'clock.

2.  No evidence of right axillary lymphadenopathy.

RECOMMENDATION:
Ultrasound guided biopsy is recommended for the right breast mass at
12 o'clock.

I have discussed the findings and recommendations with the patient.
If applicable, a reminder letter will be sent to the patient
regarding the next appointment.

BI-RADS CATEGORY  4: Suspicious.

## 2023-03-05 ENCOUNTER — Ambulatory Visit: Payer: Self-pay

## 2024-10-24 ENCOUNTER — Ambulatory Visit
Admission: RE | Admit: 2024-10-24 | Discharge: 2024-10-24 | Disposition: A | Discharge status: Home / Self Care | Attending: Emergency Medicine | Admitting: Emergency Medicine

## 2024-10-24 VITALS — BP 123/85 | HR 90 | Temp 97.8°F | Resp 18

## 2024-10-24 DIAGNOSIS — J069 Acute upper respiratory infection, unspecified: Secondary | ICD-10-CM | POA: Diagnosis present

## 2024-10-24 DIAGNOSIS — R300 Dysuria: Secondary | ICD-10-CM | POA: Insufficient documentation

## 2024-10-24 LAB — POCT URINE DIPSTICK
Bilirubin, UA: NEGATIVE
Glucose, UA: NEGATIVE mg/dL
Ketones, POC UA: NEGATIVE mg/dL
Nitrite, UA: NEGATIVE
POC PROTEIN,UA: NEGATIVE
Spec Grav, UA: 1.02 (ref 1.010–1.025)
Urobilinogen, UA: 0.2 U/dL
pH, UA: 7 (ref 5.0–8.0)

## 2024-10-24 LAB — POCT URINE PREGNANCY: Preg Test, Ur: NEGATIVE

## 2024-10-24 MED ORDER — CEFDINIR 300 MG PO CAPS: 300.0000 mg | ORAL_CAPSULE | Freq: Two times a day (BID) | ORAL | 0 refills | Status: AC

## 2024-10-24 NOTE — ED Triage Notes (Signed)
 Patient to Urgent Care with complaints of urinary frequency and dysuria/ cough/ sore throat/ fevers.   Symptoms x10 days.   Taking mucinex x2 days.

## 2024-10-24 NOTE — Discharge Instructions (Addendum)
 Take the antibiotic as directed.  The urine culture is pending.  We will call you if it shows the need to change or discontinue your antibiotic.    The antibiotic should also help your upper respiratory symptoms.    Follow up with your primary care provider.  Go to the emergency department if you have worsening symptoms.

## 2024-10-24 NOTE — ED Provider Notes (Signed)
 CAY RALPH PELT    CSN: [id number] Arrival date & time: 10/24/24  1247      History   Chief Complaint Chief Complaint  Patient presents with   Urinary Frequency    And sore throat - Entered by patient    HPI Christine Luna is a 38 y.o. female.  Patient present 10-day history of sore throat, postnasal drip, cough.  At the onset of her symptoms she had fever but has not had this recur in the last 7 days.  She has been treating her symptoms with Mucinex.  No chest pain or shortness of breath.  Patient also presents with 10-day history of dysuria and urinary frequency.  No hematuria, abdominal pain, flank pain, vaginal discharge, pelvic pain.  Her medical history includes pyelonephritis in 2024 and breast cancer.  The history is provided by the patient and medical records.    Past Medical History:  Diagnosis Date   BRCA negative 07/2013   BRCA/BART neg, 3/21 neg update MyRisk panel testing at [facility]   Breast cancer [facility]) 02/2020   triple neg; treated at [facility]   Family history of breast cancer 03/22/2013   mom age 75, triple neg, stage 3 BIBC;  IBIS=18.1%; UPDATE MYRISK TESTING LETTER SENT 11/17, 3/19   History of Papanicolaou smear of cervix 03/22/13;09/22/16   NEG; -/-   Hyperhidrosis 03/22/2013   Ovarian cyst     Patient Active Problem List   Diagnosis Date Noted   Family history of breast cancer 01/30/2020    Past Surgical History:  Procedure Laterality Date   APPENDECTOMY  2008   HERNIA REPAIR  1990    OB History     Gravida  1   Para  1   Term  1   Preterm      AB      Living  1      SAB      IAB      Ectopic      Multiple      Live Births  1            Home Medications    Prior to Admission medications   Medication Sig Start Date End Date Taking? Authorizing Provider  cefdinir (OMNICEF) 300 MG capsule Take 1 capsule (300 mg total) by mouth 2 (two) times daily for 10 days. 10/24/24 11/03/24 Yes Corlis Burnard DEL, NP  METROGEL 1 % gel  Apply 1 application topically daily. 12/09/17   [provider]    Family History Family History  Adopted: Yes  Problem Relation Age of Onset   Breast cancer Mother 23       triple neg    Social History Social History   Tobacco Use   Smoking status: Never   Smokeless tobacco: Never  Vaping Use   Vaping status: Never Used  Substance Use Topics   Alcohol use: Yes    Comment: OCC   Drug use: No     Allergies   Sulfa antibiotics   Review of Systems Review of Systems  Constitutional:  Negative for chills and fever.  HENT:  Positive for postnasal drip and sore throat. Negative for ear pain.   Respiratory:  Positive for cough. Negative for shortness of breath.   Gastrointestinal:  Negative for abdominal pain, diarrhea, nausea and vomiting.  Genitourinary:  Positive for dysuria and frequency. Negative for flank pain, hematuria, pelvic pain and vaginal discharge.     Physical Exam Triage Vital Signs ED Triage Vitals  Encounter Vitals Group     BP 10/24/24 1314 123/85     Girls Systolic BP Percentile --      Girls Diastolic BP Percentile --      Boys Systolic BP Percentile --      Boys Diastolic BP Percentile --      Pulse Rate 10/24/24 1314 90     Resp 10/24/24 1314 18     Temp 10/24/24 1314 97.8 F (36.6 C)     Temp src --      SpO2 10/24/24 1314 99 %     Weight --      Height --      Head Circumference --      Peak Flow --      Pain Score 10/24/24 1317 6     Pain Loc --      Pain Education --      Exclude from Growth Chart --    No data found.  Updated Vital Signs BP 123/85   Pulse 90   Temp 97.8 F (36.6 C)   Resp 18   LMP 10/12/2024   SpO2 99%   Visual Acuity Right Eye Distance:   Left Eye Distance:   Bilateral Distance:    Right Eye Near:   Left Eye Near:    Bilateral Near:     Physical Exam Constitutional:      General: She is not in acute distress. HENT:     Right Ear: Tympanic membrane normal.     Left Ear: Tympanic  membrane normal.     Nose: Congestion present.     Mouth/Throat:     Mouth: Mucous membranes are moist.     Pharynx: Oropharynx is clear.     Comments: PND Cardiovascular:     Rate and Rhythm: Normal rate and regular rhythm.     Heart sounds: Normal heart sounds.  Pulmonary:     Effort: Pulmonary effort is normal. No respiratory distress.     Breath sounds: Normal breath sounds.  Abdominal:     General: Bowel sounds are normal.     Palpations: Abdomen is soft.     Tenderness: There is no abdominal tenderness. There is no right CVA tenderness, left CVA tenderness, guarding or rebound.  Neurological:     Mental Status: She is alert.      UC Treatments / Results  Labs (all labs ordered are listed, but only abnormal results are displayed) Labs Reviewed  POCT URINE DIPSTICK - Abnormal; Notable for the following components:      Result Value   Blood, UA small (*)    Leukocytes, UA Small (1+) (*)    All other components within normal limits  URINE CULTURE  POCT URINE PREGNANCY    EKG   Radiology No results found.  Procedures Procedures (including critical care time)  Medications Ordered in UC Medications - No data to display  Initial Impression / Assessment and Plan / UC Course  I have reviewed the triage vital signs and the nursing notes.  Pertinent labs & imaging results that were available during my care of the patient were reviewed by me and considered in my medical decision making (see chart for details).    Dysuria, acute upper respiratory infection.  Afebrile and vital signs are stable.  Lungs are clear and O2 sat is 99% on room air.  Treating today with 10-day course of cefdinir to treat urine and respiratory symptoms.  Urine culture pending.  Instructed patient to follow-up  with her PCP.  ED precautions given.  Education provided on dysuria and URI.  She agrees to plan of care.  Final Clinical Impressions(s) / UC Diagnoses   Final diagnoses:  Dysuria   Acute upper respiratory infection     Discharge Instructions      Take the antibiotic as directed.  The urine culture is pending.  We will call you if it shows the need to change or discontinue your antibiotic.    The antibiotic should also help your upper respiratory symptoms.    Follow up with your primary care provider.  Go to the emergency department if you have worsening symptoms.        ED Prescriptions     Medication Sig Dispense Auth. Provider   cefdinir (OMNICEF) 300 MG capsule Take 1 capsule (300 mg total) by mouth 2 (two) times daily for 10 days. 20 capsule Corlis Burnard DEL, NP      PDMP not reviewed this encounter.   Corlis Burnard DEL, NP 10/24/24 [phone]

## 2024-10-26 ENCOUNTER — Ambulatory Visit (HOSPITAL_COMMUNITY): Payer: Self-pay

## 2024-10-26 LAB — URINE CULTURE: Culture: 50000 — AB
